# Patient Record
Sex: Male | Born: 1970 | Race: Black or African American | Hispanic: No | Marital: Single | Smoking: Former smoker
Health system: Southern US, Community
[De-identification: ages and names within clinical notes are randomized; demographics above are authoritative.]

---

## 2012-05-22 ENCOUNTER — Emergency Department (HOSPITAL_COMMUNITY)
Admission: EM | Admit: 2012-05-22 | Discharge: 2012-05-22 | Disposition: A | Discharge status: Home / Self Care | Payer: Managed Care, Other (non HMO) | Attending: Emergency Medicine | Admitting: Emergency Medicine

## 2012-05-22 ENCOUNTER — Encounter (HOSPITAL_COMMUNITY): Payer: Self-pay | Admitting: *Deleted

## 2012-05-22 DIAGNOSIS — K4090 Unilateral inguinal hernia, without obstruction or gangrene, not specified as recurrent: Secondary | ICD-10-CM | POA: Insufficient documentation

## 2012-05-22 NOTE — ED Notes (Signed)
Dr. Wofford at bedside 

## 2012-05-22 NOTE — ED Provider Notes (Signed)
I saw and evaluated the patient, reviewed the resident's note and I agree with the findings and plan.   Pt with right groin hernia, easily reduced.  No signs of incarceration. Discussed with general surgeon, return precautions discussed, urged close follow up with a surgeon.    Ralph Tran. Oletta Lamas, MD 05/22/12 [phone]

## 2012-05-22 NOTE — ED Provider Notes (Signed)
History     CSN: [id number]  Arrival date & time 05/22/12  1551   First MD Initiated Contact with Patient 05/22/12 1705      Chief Complaint  Patient presents with  . R groin/abd pain     (Consider location/radiation/quality/duration/timing/severity/associated sxs/prior treatment) Patient is a 42 y.o. male presenting with male genitourinary complaint. The history is provided by the patient.  Male GU Problem Primary symptoms comment: groin swelling. This is a new problem. Episode onset: several days ago. Episode frequency: intermittent. The problem has been gradually worsening. Context: worse when standing, better when lying down. Associated symptoms include abdominal pain (RLQ). Pertinent negatives include no nausea, no vomiting, no frequency, no constipation and no diarrhea. There has been no fever. He has tried nothing for the symptoms.    History reviewed. No pertinent past medical history.  History reviewed. No pertinent past surgical history.  No family history on file.  History  Substance Use Topics  . Smoking status: Former Games developer  . Smokeless tobacco: Not on file  . Alcohol Use: Yes     Comment: daily      Review of Systems  Constitutional: Negative for fever.  HENT: Negative for congestion, facial swelling and trouble swallowing.   Respiratory: Negative for cough and shortness of breath.   Cardiovascular: Negative for chest pain.  Gastrointestinal: Positive for abdominal pain (RLQ). Negative for nausea, vomiting, diarrhea and constipation.  Genitourinary: Negative for frequency and difficulty urinating.  All other systems reviewed and are negative.    Allergies  Review of patient's allergies indicates no known allergies.  Home Medications  No current outpatient prescriptions on file.  BP 138/82  Pulse 66  Temp(Src) 97.7 F (36.5 C) (Oral)  Resp 18  SpO2 100%  Physical Exam  Nursing note and vitals reviewed. Constitutional: He is oriented to  person, place, and time. He appears well-developed and well-nourished. No distress.  HENT:  Head: Normocephalic and atraumatic.  Mouth/Throat: Oropharynx is clear and moist.  Eyes: Conjunctivae are normal. Pupils are equal, round, and reactive to light. No scleral icterus.  Neck: Normal range of motion. Neck supple.  Cardiovascular: Normal rate, regular rhythm and intact distal pulses.   Pulmonary/Chest: Effort normal. No stridor. No respiratory distress.  Abdominal: Soft. He exhibits no distension. There is no tenderness. There is no rebound and no guarding. A hernia is present. Hernia confirmed positive in the right inguinal area.  Genitourinary: Right testis shows mass (soft, reducible). Right testis shows no tenderness. Left testis shows no mass and no tenderness. Uncircumcised. No discharge found.  Musculoskeletal: Normal range of motion. He exhibits no edema.  Neurological: He is alert and oriented to person, place, and time.  Skin: Skin is warm and dry. No rash noted.  Psychiatric: He has a normal mood and affect. His behavior is normal.    ED Course  Hernia reduction Date/Time: 05/22/2012 5:42 PM Performed by: Rennis Petty Authorized by: Lear Ng Consent: Verbal consent obtained. Patient tolerance: Patient tolerated the procedure well with no immediate complications. Comments: Reduced right inguinal hernia with constant direct pressure.     (including critical care time)  Labs Reviewed - No data to display No results found.   1. Right inguinal hernia       MDM  Easily reducible large right inguinal hernia.  Plan surgery follow up and lifting precautions.         Rennis Petty, MD 05/22/12 [phone]

## 2012-05-22 NOTE — ED Notes (Signed)
Pt is here with couple days of lower right abdominal pain and discomfort in groin area.  No NVD or problems urinating.

## 2012-05-22 NOTE — ED Notes (Signed)
Pt given d/c teaching and follow up care instructions; pt alert and mentating appropriately upon d.c teaching given; pt denies pain upon d/c; pt verbalizes understanding of d/c teaching and has no further questions upon d/c. NAD noted upon d/c.

## 2012-05-22 NOTE — ED Notes (Signed)
Pt states he lifts heavy items at work and has placed increased strain on lower body; pt states he has seen the hernia at times on his right groin area; pt denies n/v/d; pt denies pain; pt alert and mentating appropriately; NAD noted at this time

## 2012-05-23 ENCOUNTER — Telehealth (INDEPENDENT_AMBULATORY_CARE_PROVIDER_SITE_OTHER): Payer: Self-pay | Admitting: General Surgery

## 2012-05-23 NOTE — Telephone Encounter (Signed)
LMOM making patient aware of appt date/time scheduled for Thursday 05/25/2012 @ 4:30pm. Patient to call with any questions.

## 2012-05-23 NOTE — Telephone Encounter (Signed)
Message copied by Liliana Cline on Tue May 23, 2012  9:13 AM ------      Message from: Almond Lint      Created: Tue May 23, 2012  3:45 AM       This guy needs appt for inguinal hernia with Dr. Andrey Campanile.      tx      FB ------

## 2012-05-25 ENCOUNTER — Encounter (INDEPENDENT_AMBULATORY_CARE_PROVIDER_SITE_OTHER): Payer: Self-pay | Admitting: General Surgery

## 2012-05-25 ENCOUNTER — Ambulatory Visit (INDEPENDENT_AMBULATORY_CARE_PROVIDER_SITE_OTHER): Payer: Managed Care, Other (non HMO) | Admitting: General Surgery

## 2012-05-25 VITALS — BP 118/80 | HR 61 | Temp 100.1°F | Resp 18 | Ht 74.0 in | Wt 160.6 lb

## 2012-05-25 DIAGNOSIS — K4090 Unilateral inguinal hernia, without obstruction or gangrene, not specified as recurrent: Secondary | ICD-10-CM | POA: Insufficient documentation

## 2012-05-25 NOTE — Patient Instructions (Signed)
Hernia A hernia occurs when an internal organ pushes out through a weak spot in the abdominal wall. Hernias most commonly occur in the groin and around the navel. Hernias often can be pushed back into place (reduced). Most hernias tend to get worse over time. Some abdominal hernias can get stuck in the opening (irreducible or incarcerated hernia) and cannot be reduced. An irreducible abdominal hernia which is tightly squeezed into the opening is at risk for impaired blood supply (strangulated hernia). A strangulated hernia is a medical emergency. Because of the risk for an irreducible or strangulated hernia, surgery may be recommended to repair a hernia. CAUSES   Heavy lifting.  Prolonged coughing.  Straining to have a bowel movement.  A cut (incision) made during an abdominal surgery. HOME CARE INSTRUCTIONS   Bed rest is not required. You may continue your normal activities.  Avoid lifting more than 10 pounds (4.5 kg) or straining.  Cough gently. If you are a smoker it is best to stop. Even the best hernia repair can break down with the continual strain of coughing. Even if you do not have your hernia repaired, a cough will continue to aggravate the problem.  Do not wear anything tight over your hernia. Do not try to keep it in with an outside bandage or truss. These can damage abdominal contents if they are trapped within the hernia sac.  Eat a normal diet.  Avoid constipation. Straining over long periods of time will increase hernia size and encourage breakdown of repairs. If you cannot do this with diet alone, stool softeners may be used. SEEK IMMEDIATE MEDICAL CARE IF:   You have a fever.  You develop increasing abdominal pain.  You feel nauseous or vomit.  Your hernia is stuck outside the abdomen, looks discolored, feels hard, or is tender.  You have any changes in your bowel habits or in the hernia that are unusual for you.  You have increased pain or swelling around the  hernia.  You cannot push the hernia back in place by applying gentle pressure while lying down. MAKE SURE YOU:   Understand these instructions.  Will watch your condition.  Will get help right away if you are not doing well or get worse. Document Released: 02/22/2005 Document Revised: 05/17/2011 Document Reviewed: 10/12/2007 Exit[employer] Patient Information 2013 [employer].  

## 2012-05-25 NOTE — Progress Notes (Signed)
Patient ID: Ralph Tran, male   DOB: 1970-12-10, 42 y.o.   MRN: [id number]  Chief Complaint  Patient presents with  . New Evaluation    eval ing hernia    HPI Ralph Tran is a 42 y.o. male.   HPI 42yo AAM referred by Dr Quita Skye For evaluation of a right inguinal hernia. The patient states that he noticed it about 2 years ago. At that time it was small. Over the past 2 years he has had some intermittent abdominal pain. More recently he has had a pulling sensation in his right groin. This sensation is typically noticed toward the end of the day, when he is getting up from a laying position, or after heavy lifting. He does have some discomfort in his right groin after standing or walking for prolonged period of time. He denies any fever, chills, nausea, vomiting, diarrhea or constipation. He denies any dysuria. He works in Teacher, English as a foreign language.   History reviewed. No pertinent past medical history.  History reviewed. No pertinent past surgical history.  Family History  Problem Relation Age of Onset  . Heart disease Father     Social History History  Substance Use Topics  . Smoking status: Former Smoker    Types: Cigarettes    Quit date: 05/06/2008  . Smokeless tobacco: Never Used  . Alcohol Use: 2.4 oz/week    4 Cans of beer per week     Comment: Daily.    No Known Allergies  No current outpatient prescriptions on file.   No current facility-administered medications for this visit.    Review of Systems Review of Systems  Constitutional: Negative for fever, chills, appetite change and unexpected weight change.  HENT: Negative for congestion and trouble swallowing.   Eyes: Negative for visual disturbance.  Respiratory: Negative for chest tightness and shortness of breath.   Cardiovascular: Negative for chest pain and leg swelling.       No PND, no orthopnea, no DOE  Gastrointestinal:       See HPI  Genitourinary: Negative for dysuria and hematuria.  Musculoskeletal:  Negative.   Skin: Negative for rash.  Neurological: Negative for seizures and speech difficulty.  Hematological: Does not bruise/bleed easily.  Psychiatric/Behavioral: Negative for behavioral problems and confusion.    Blood pressure 118/80, pulse 61, temperature 100.1 F (37.8 C), temperature source Temporal, resp. rate 18, height 6\' 2"  (1.88 m), weight 160 lb 9.6 oz (72.848 kg).  Physical Exam Physical Exam  Vitals reviewed. Constitutional: He is oriented to person, place, and time. He appears well-developed and well-nourished. No distress.  HENT:  Head: Normocephalic and atraumatic.  Right Ear: External ear normal.  Left Ear: External ear normal.  Eyes: Conjunctivae are normal. No scleral icterus.  Neck: Normal range of motion. Neck supple. No tracheal deviation present. No thyromegaly present.  Cardiovascular: Normal rate, regular rhythm and normal heart sounds.   Pulmonary/Chest: Effort normal and breath sounds normal. No respiratory distress. He has no wheezes. He has no rales.  Abdominal: Soft. He exhibits no distension. There is no tenderness. There is no rebound. A hernia is present. Hernia confirmed positive in the right inguinal area. Hernia confirmed negative in the left inguinal area.  Genitourinary: Testes normal and penis normal.    Uncircumcised.  Large right inguinal/scrotal hernia, soft, nontender; reducible.   Musculoskeletal: He exhibits no edema.  Lymphadenopathy:    He has no cervical adenopathy.  Neurological: He is alert and oriented to person, place, and time.  Skin: Skin  is warm and dry. No rash noted. He is not diaphoretic. No erythema. No pallor.  Psychiatric: He has a normal mood and affect. His behavior is normal. Judgment and thought content normal.    Data Reviewed ED note  Assessment    Large right inguinal hernia     Plan    We discussed the etiology of inguinal hernias. We discussed the signs & symptoms of incarceration & strangulation.   We discussed non-operative and operative management.  The patient has elected to proceed with OPEN REPAIR OF RIGHT INGUINAL HERNIA WITH MESH   I described the procedure in detail.  The patient was given educational material. We discussed the risks and benefits including but not limited to bleeding, infection, chronic inguinal pain, nerve entrapment, hernia recurrence, mesh complications, hematoma formation, urinary retention, injury to the testicles, numbness in the groin, blood clots, injury to the surrounding structures, and anesthesia risk. We also discussed the typical post operative recovery course, including no heavy lifting for 6 weeks. I explained that the likelihood of improvement of their symptoms is good.  I explained to the patient that he was at higher risk for hematoma formation, hernia recurrence, chronic inguinal pain, and injury to surrounding structures given the size of his inguinal hernia.  He has elected to proceed with surgery  Mary Sella. Andrey Campanile, MD, FACS General, Bariatric, & Minimally Invasive Surgery [facility], [place]          [facility] M 05/25/2012, 5:14 PM

## 2017-04-15 ENCOUNTER — Ambulatory Visit (HOSPITAL_COMMUNITY)
Admission: EM | Admit: 2017-04-15 | Discharge: 2017-04-17 | Disposition: A | Discharge status: Home / Self Care | Payer: Managed Care, Other (non HMO) | Attending: General Surgery | Admitting: General Surgery

## 2017-04-15 ENCOUNTER — Encounter (HOSPITAL_COMMUNITY): Payer: Self-pay

## 2017-04-15 ENCOUNTER — Other Ambulatory Visit: Payer: Self-pay

## 2017-04-15 DIAGNOSIS — R61 Generalized hyperhidrosis: Secondary | ICD-10-CM | POA: Insufficient documentation

## 2017-04-15 DIAGNOSIS — Z8249 Family history of ischemic heart disease and other diseases of the circulatory system: Secondary | ICD-10-CM | POA: Insufficient documentation

## 2017-04-15 DIAGNOSIS — F17210 Nicotine dependence, cigarettes, uncomplicated: Secondary | ICD-10-CM | POA: Insufficient documentation

## 2017-04-15 DIAGNOSIS — K4030 Unilateral inguinal hernia, with obstruction, without gangrene, not specified as recurrent: Secondary | ICD-10-CM | POA: Insufficient documentation

## 2017-04-15 LAB — I-STAT CG4 LACTIC ACID, ED: Lactic Acid, Venous: 5.02 mmol/L (ref 0.5–1.9)

## 2017-04-15 MED ORDER — ONDANSETRON HCL 4 MG/2ML IJ SOLN
4.0000 mg | Freq: Once | INTRAMUSCULAR | Status: AC
  Administered 2017-04-15: 4 mg via INTRAVENOUS
  Filled 2017-04-15: qty 2

## 2017-04-15 MED ORDER — HYDROMORPHONE HCL 1 MG/ML IJ SOLN
1.0000 mg | Freq: Once | INTRAMUSCULAR | Status: AC
Start: 2017-04-15 — End: 2017-04-15
  Administered 2017-04-15: 1 mg via INTRAVENOUS
  Filled 2017-04-15: qty 1

## 2017-04-15 MED ORDER — SODIUM CHLORIDE 0.9 % IV BOLUS (SEPSIS)
1000.0000 mL | Freq: Once | INTRAVENOUS | Status: AC
  Administered 2017-04-15: 1000 mL via INTRAVENOUS

## 2017-04-15 NOTE — ED Provider Notes (Addendum)
[facility] EMERGENCY DEPARTMENT Provider Note   CSN: [id number] Arrival date & time: 04/15/17  2300     History   Chief Complaint Chief Complaint  Patient presents with  . Abdominal Pain    HPI Ralph Tran is a 47 y.o. male.  Patient presents to the ER for evaluation of abdominal pain.  Patient comes to the ER by ambulance.  1 hour before arrival in the ER he had sudden onset of severe, sharp and tearing pain in his right groin when he turns to the side and laughed.  He has a history of a known right inguinal hernia.  Patient received fentanyl 50 mcg x2 by EMS.  Blood pressure dropped somewhat, has been given a 500 mL fluid bolus.  Patient arrives still experiencing severe pain.  He has had multiple episodes of emesis.      History reviewed. No pertinent past medical history.  Patient Active Problem List   Diagnosis Date Noted  . Right inguinal hernia 05/25/2012    History reviewed. No pertinent surgical history.     Home Medications    Prior to Admission medications   Not on File    Family History Family History  Problem Relation Age of Onset  . Heart disease Father     Social History Social History   Tobacco Use  . Smoking status: Former Smoker    Types: Cigarettes    Last attempt to quit: 05/06/2008    Years since quitting: 8.9  . Smokeless tobacco: Never Used  Substance Use Topics  . Alcohol use: Yes    Alcohol/week: 2.4 oz    Types: 4 Cans of beer per week    Comment: Daily.  . Drug use: No     Allergies   Patient has no known allergies.   Review of Systems Review of Systems  Gastrointestinal: Positive for abdominal pain.  All other systems reviewed and are negative.    Physical Exam Updated Vital Signs BP 111/74 (BP Location: Right Arm)   Pulse (!) 55   Temp 98.3 F (36.8 C) (Rectal)   Resp 20   Ht 6\' 3"  (1.905 m)   Wt 72.6 kg (160 lb)   SpO2 100%   BMI 20.00 kg/m   Physical Exam  Constitutional: He is  oriented to person, place, and time. He appears well-developed and well-nourished. No distress.  HENT:  Head: Normocephalic and atraumatic.  Right Ear: Hearing normal.  Left Ear: Hearing normal.  Nose: Nose normal.  Mouth/Throat: Oropharynx is clear and moist and mucous membranes are normal.  Eyes: Conjunctivae and EOM are normal. Pupils are equal, round, and reactive to light.  Neck: Normal range of motion. Neck supple.  Cardiovascular: Regular rhythm, S1 normal and S2 normal. Exam reveals no gallop and no friction rub.  No murmur heard. Pulmonary/Chest: Effort normal and breath sounds normal. No respiratory distress. He exhibits no tenderness.  Abdominal: Soft. Normal appearance and bowel sounds are normal. There is no hepatosplenomegaly. There is no tenderness. There is no rebound, no guarding, no tenderness at McBurney's point and negative Murphy's sign. A hernia (large right inguinal, non-reducible) is present.  Musculoskeletal: Normal range of motion.  Neurological: He is alert and oriented to person, place, and time. He has normal strength. No cranial nerve deficit or sensory deficit. Coordination normal. GCS eye subscore is 4. GCS verbal subscore is 5. GCS motor subscore is 6.  Skin: Skin is warm, dry and intact. No rash noted. No cyanosis.  Psychiatric: He has a normal mood and affect. His speech is normal and behavior is normal. Thought content normal.  Nursing note and vitals reviewed.    ED Treatments / Results  Labs (all labs ordered are listed, but only abnormal results are displayed) Labs Reviewed  I-STAT CG4 LACTIC ACID, ED - Abnormal; Notable for the following components:      Result Value   Lactic Acid, Venous 5.02 (*)    All other components within normal limits  CBC WITH DIFFERENTIAL/PLATELET  BASIC METABOLIC PANEL  LIPASE, BLOOD  URINALYSIS, ROUTINE W REFLEX MICROSCOPIC    EKG  EKG Interpretation  Date/Time:  Friday April 15 2017 23:32:52 EST Ventricular  Rate:  50 PR Interval:    QRS Duration: 106 QT Interval:  462 QTC Calculation: 422 R Axis:   50 Text Interpretation:  Sinus rhythm Atrial premature complex RSR' in V1 or V2, probably normal variant Left ventricular hypertrophy Nonspecific T abnrm, anterolateral leads ST elev, probable normal early repol pattern No previous tracing Confirmed by Gilda CreasePollina, Christopher J [phone]([id number]) on 04/15/2017 11:53:41 PM       Radiology No results found.  Procedures Procedures (including critical care time)  Medications Ordered in ED Medications  sodium chloride 0.9 % bolus 1,000 mL (1,000 mLs Intravenous New Bag/Given 04/15/17 2314)  HYDROmorphone (DILAUDID) injection 1 mg (1 mg Intravenous Given 04/15/17 2312)  ondansetron (ZOFRAN) injection 4 mg (4 mg Intravenous Given 04/15/17 2312)     Initial Impression / Assessment and Plan / ED Course  I have reviewed the triage vital signs and the nursing notes.  Pertinent labs & imaging results that were available during my care of the patient were reviewed by me and considered in my medical decision making (see chart for details).     Patient presents to the emergency department for evaluation of sudden onset of abdominal pain after a Valsalva maneuver.  He has a known right inguinal hernia.  Examination reveals large hernia mass into the right scrotum.  This is consistent with incarcerated inguinal hernia.  Patient administered IV fluid bolus, additional analgesia.  Discussed with general surgery, will see the patient.  Final Clinical Impressions(s) / ED Diagnoses   Final diagnoses:  Incarcerated right inguinal hernia    ED Discharge Orders    None       Gilda CreasePollina, Christopher J, MD 04/15/17 [id number]    Gilda CreasePollina, Christopher J, MD 04/15/17 2354

## 2017-04-15 NOTE — ED Triage Notes (Addendum)
Per [facility], pt called out for sudden sharp right sided abdominal pain after laughing. Pt also did heavy lifting at work. Pt has hx right inguinal hernia. Pt received 100 mcg of fentanyl via EMS and 4 of zofran. Pt has 20 G in RAC. Pt received 250 bolus of NS via EMS after systolic pressure dropped from 108 to 82. Pt is alert and oriented. EMS reports multiple episodes of N/V and rigid abdomen.

## 2017-04-15 NOTE — ED Notes (Signed)
ED Provider at bedside. 

## 2017-04-16 ENCOUNTER — Emergency Department (HOSPITAL_COMMUNITY): Payer: Managed Care, Other (non HMO)

## 2017-04-16 ENCOUNTER — Emergency Department (HOSPITAL_COMMUNITY): Payer: Managed Care, Other (non HMO) | Admitting: Anesthesiology

## 2017-04-16 ENCOUNTER — Encounter (HOSPITAL_COMMUNITY): Payer: Self-pay

## 2017-04-16 ENCOUNTER — Encounter (HOSPITAL_COMMUNITY)
Admission: EM | Disposition: A | Discharge status: Home / Self Care | Payer: Self-pay | Source: Home / Self Care | Attending: Emergency Medicine

## 2017-04-16 ENCOUNTER — Other Ambulatory Visit: Payer: Self-pay

## 2017-04-16 DIAGNOSIS — K4030 Unilateral inguinal hernia, with obstruction, without gangrene, not specified as recurrent: Secondary | ICD-10-CM | POA: Diagnosis present

## 2017-04-16 HISTORY — PX: INGUINAL HERNIA REPAIR: SHX194

## 2017-04-16 LAB — CBC WITH DIFFERENTIAL/PLATELET
BASOS ABS: 0 10*3/uL (ref 0.0–0.1)
Basophils Relative: 0 %
EOS PCT: 1 %
Eosinophils Absolute: 0.1 10*3/uL (ref 0.0–0.7)
HEMATOCRIT: 39.5 % (ref 39.0–52.0)
Hemoglobin: 12.9 g/dL — ABNORMAL LOW (ref 13.0–17.0)
LYMPHS PCT: 21 %
Lymphs Abs: 1.9 10*3/uL (ref 0.7–4.0)
MCH: 31.6 pg (ref 26.0–34.0)
MCHC: 32.7 g/dL (ref 30.0–36.0)
MCV: 96.8 fL (ref 78.0–100.0)
MONO ABS: 0.5 10*3/uL (ref 0.1–1.0)
MONOS PCT: 5 %
Neutro Abs: 6.5 10*3/uL (ref 1.7–7.7)
Neutrophils Relative %: 73 %
PLATELETS: 227 10*3/uL (ref 150–400)
RBC: 4.08 MIL/uL — ABNORMAL LOW (ref 4.22–5.81)
RDW: 14.1 % (ref 11.5–15.5)
WBC: 9 10*3/uL (ref 4.0–10.5)

## 2017-04-16 LAB — BASIC METABOLIC PANEL
ANION GAP: 11 (ref 5–15)
ANION GAP: 12 (ref 5–15)
BUN: 7 mg/dL (ref 6–20)
BUN: 9 mg/dL (ref 6–20)
CALCIUM: 7.9 mg/dL — AB (ref 8.9–10.3)
CALCIUM: 8.1 mg/dL — AB (ref 8.9–10.3)
CO2: 23 mmol/L (ref 22–32)
CO2: 22 mmol/L (ref 22–32)
CREATININE: 0.92 mg/dL (ref 0.61–1.24)
Chloride: 103 mmol/L (ref 101–111)
Chloride: 108 mmol/L (ref 101–111)
Creatinine, Ser: 0.81 mg/dL (ref 0.61–1.24)
GFR calc Af Amer: >60 mL/min (ref >60)
GFR calc Af Amer: >60 mL/min (ref >60)
GLUCOSE: 122 mg/dL — AB (ref 65–99)
GLUCOSE: 125 mg/dL — AB (ref 65–99)
Potassium: 3.5 mmol/L (ref 3.5–5.1)
Potassium: 4 mmol/L (ref 3.5–5.1)
Sodium: 137 mmol/L (ref 135–145)
Sodium: 142 mmol/L (ref 135–145)

## 2017-04-16 LAB — LIPASE, BLOOD: Lipase: 25 U/L (ref 11–51)

## 2017-04-16 SURGERY — REPAIR, HERNIA, INGUINAL, INCARCERATED
Anesthesia: General | Laterality: Right

## 2017-04-16 MED ORDER — SUGAMMADEX SODIUM 200 MG/2ML IV SOLN
INTRAVENOUS | Status: DC | PRN
  Administered 2017-04-16: 200 mg via INTRAVENOUS

## 2017-04-16 MED ORDER — SUCCINYLCHOLINE 20MG/ML (10ML) SYRINGE FOR MEDFUSION PUMP - OPTIME
INTRAMUSCULAR | Status: DC | PRN
  Administered 2017-04-16: 120 mg via INTRAVENOUS

## 2017-04-16 MED ORDER — ACETAMINOPHEN 325 MG PO TABS: 650.0000 mg | ORAL_TABLET | Freq: Four times a day (QID) | ORAL | Status: DC | PRN

## 2017-04-16 MED ORDER — CEFAZOLIN SODIUM-DEXTROSE 2-4 GM/100ML-% IV SOLN
INTRAVENOUS | Status: AC
  Filled 2017-04-16: qty 100

## 2017-04-16 MED ORDER — HYDROMORPHONE HCL 1 MG/ML IJ SOLN
1.0000 mg | Freq: Once | INTRAMUSCULAR | Status: AC
  Administered 2017-04-16: 1 mg via INTRAVENOUS
  Filled 2017-04-16: qty 1

## 2017-04-16 MED ORDER — ACETAMINOPHEN 650 MG RE SUPP: 650.0000 mg | Freq: Four times a day (QID) | RECTAL | Status: DC | PRN

## 2017-04-16 MED ORDER — KETOROLAC TROMETHAMINE 15 MG/ML IJ SOLN
15.0000 mg | Freq: Four times a day (QID) | INTRAMUSCULAR | Status: DC | PRN
  Administered 2017-04-16: 15 mg via INTRAVENOUS
  Filled 2017-04-16: qty 1

## 2017-04-16 MED ORDER — BUPIVACAINE HCL (PF) 0.25 % IJ SOLN
INTRAMUSCULAR | Status: AC
  Filled 2017-04-16: qty 30

## 2017-04-16 MED ORDER — DEXAMETHASONE SODIUM PHOSPHATE 10 MG/ML IJ SOLN
INTRAMUSCULAR | Status: DC | PRN
  Administered 2017-04-16: 10 mg via INTRAVENOUS

## 2017-04-16 MED ORDER — DOCUSATE SODIUM 100 MG PO CAPS
100.0000 mg | ORAL_CAPSULE | Freq: Two times a day (BID) | ORAL | Status: DC
  Administered 2017-04-16 – 2017-04-17 (×3): 100 mg via ORAL
  Filled 2017-04-16 (×3): qty 1

## 2017-04-16 MED ORDER — HYDROMORPHONE HCL 1 MG/ML IJ SOLN
0.2500 mg | INTRAMUSCULAR | Status: DC | PRN
  Administered 2017-04-16 (×2): 0.5 mg via INTRAVENOUS

## 2017-04-16 MED ORDER — FENTANYL CITRATE (PF) 250 MCG/5ML IJ SOLN
INTRAMUSCULAR | Status: DC | PRN
  Administered 2017-04-16: 100 ug via INTRAVENOUS
  Administered 2017-04-16: 50 ug via INTRAVENOUS

## 2017-04-16 MED ORDER — DEXTROSE 5 % IV SOLN: 500.0000 mg | Freq: Three times a day (TID) | INTRAVENOUS | Status: DC | PRN

## 2017-04-16 MED ORDER — DIPHENHYDRAMINE HCL 50 MG/ML IJ SOLN
25.0000 mg | Freq: Three times a day (TID) | INTRAMUSCULAR | Status: DC | PRN
  Administered 2017-04-16: 25 mg via INTRAVENOUS
  Filled 2017-04-16: qty 1

## 2017-04-16 MED ORDER — ENOXAPARIN SODIUM 40 MG/0.4ML ~~LOC~~ SOLN: 40.0000 mg | SUBCUTANEOUS | Status: DC

## 2017-04-16 MED ORDER — GABAPENTIN 300 MG PO CAPS
300.0000 mg | ORAL_CAPSULE | Freq: Three times a day (TID) | ORAL | Status: DC
  Administered 2017-04-16 (×3): 300 mg via ORAL
  Filled 2017-04-16 (×3): qty 1

## 2017-04-16 MED ORDER — MIDAZOLAM HCL 2 MG/2ML IJ SOLN
INTRAMUSCULAR | Status: DC | PRN
  Administered 2017-04-16: 2 mg via INTRAVENOUS

## 2017-04-16 MED ORDER — MIDAZOLAM HCL 2 MG/2ML IJ SOLN
INTRAMUSCULAR | Status: AC
  Filled 2017-04-16: qty 2

## 2017-04-16 MED ORDER — FENTANYL CITRATE (PF) 250 MCG/5ML IJ SOLN
INTRAMUSCULAR | Status: AC
  Filled 2017-04-16: qty 5

## 2017-04-16 MED ORDER — ONDANSETRON HCL 4 MG/2ML IJ SOLN
INTRAMUSCULAR | Status: DC | PRN
  Administered 2017-04-16: 4 mg via INTRAVENOUS

## 2017-04-16 MED ORDER — OXYCODONE HCL 5 MG PO TABS: 5.0000 mg | ORAL_TABLET | Freq: Four times a day (QID) | ORAL | Status: DC | PRN

## 2017-04-16 MED ORDER — SODIUM CHLORIDE 0.9 % IV SOLN
INTRAVENOUS | Status: DC
  Administered 2017-04-16 (×2): via INTRAVENOUS

## 2017-04-16 MED ORDER — PROPOFOL 10 MG/ML IV BOLUS
INTRAVENOUS | Status: DC | PRN
  Administered 2017-04-16: 180 mg via INTRAVENOUS

## 2017-04-16 MED ORDER — CEFAZOLIN SODIUM-DEXTROSE 2-3 GM-%(50ML) IV SOLR
INTRAVENOUS | Status: DC | PRN
  Administered 2017-04-16: 2 g via INTRAVENOUS

## 2017-04-16 MED ORDER — ROCURONIUM 10MG/ML (10ML) SYRINGE FOR MEDFUSION PUMP - OPTIME
INTRAVENOUS | Status: DC | PRN
  Administered 2017-04-16: 30 mg via INTRAVENOUS

## 2017-04-16 MED ORDER — ACETAMINOPHEN 325 MG PO TABS
650.0000 mg | ORAL_TABLET | Freq: Four times a day (QID) | ORAL | Status: DC
  Administered 2017-04-16 – 2017-04-17 (×5): 650 mg via ORAL
  Filled 2017-04-16 (×5): qty 2

## 2017-04-16 MED ORDER — ONDANSETRON 4 MG PO TBDP: 4.0000 mg | ORAL_TABLET | Freq: Four times a day (QID) | ORAL | Status: DC | PRN

## 2017-04-16 MED ORDER — PROPOFOL 10 MG/ML IV BOLUS
INTRAVENOUS | Status: AC
  Filled 2017-04-16: qty 20

## 2017-04-16 MED ORDER — HYDROMORPHONE HCL 1 MG/ML IJ SOLN
INTRAMUSCULAR | Status: AC
  Administered 2017-04-16: 0.5 mg via INTRAVENOUS
  Filled 2017-04-16: qty 1

## 2017-04-16 MED ORDER — LIDOCAINE HCL (CARDIAC) 20 MG/ML IV SOLN
INTRAVENOUS | Status: DC | PRN
  Administered 2017-04-16: 60 mg via INTRATRACHEAL

## 2017-04-16 MED ORDER — HYDROMORPHONE HCL 1 MG/ML IJ SOLN: 0.5000 mg | INTRAMUSCULAR | Status: DC | PRN

## 2017-04-16 MED ORDER — ONDANSETRON HCL 4 MG/2ML IJ SOLN: 4.0000 mg | Freq: Four times a day (QID) | INTRAMUSCULAR | Status: DC | PRN

## 2017-04-16 MED ORDER — LACTATED RINGERS IV SOLN
INTRAVENOUS | Status: DC | PRN
Start: 2017-04-16 — End: 2017-04-16
  Administered 2017-04-16 (×2): via INTRAVENOUS

## 2017-04-16 SURGICAL SUPPLY — 55 items
APPLIER CLIP 9.375 MED OPEN (MISCELLANEOUS) ×3
BLADE CLIPPER SURG (BLADE) ×3 IMPLANT
BLADE SURG 10 STRL SS (BLADE) ×3 IMPLANT
BLADE SURG 15 STRL LF DISP TIS (BLADE) ×1 IMPLANT
BLADE SURG 15 STRL SS (BLADE) ×2
CANISTER SUCT 3000ML PPV (MISCELLANEOUS) ×3 IMPLANT
CHLORAPREP W/TINT 26ML (MISCELLANEOUS) ×3 IMPLANT
CLIP APPLIE 9.375 MED OPEN (MISCELLANEOUS) ×1 IMPLANT
COVER SURGICAL LIGHT HANDLE (MISCELLANEOUS) ×3 IMPLANT
DECANTER SPIKE VIAL GLASS SM (MISCELLANEOUS) ×3 IMPLANT
DERMABOND ADHESIVE PROPEN (GAUZE/BANDAGES/DRESSINGS) ×2
DERMABOND ADVANCED (GAUZE/BANDAGES/DRESSINGS) ×2
DERMABOND ADVANCED .7 DNX12 (GAUZE/BANDAGES/DRESSINGS) ×1 IMPLANT
DERMABOND ADVANCED .7 DNX6 (GAUZE/BANDAGES/DRESSINGS) ×1 IMPLANT
DRAIN PENROSE 1/2X12 LTX STRL (WOUND CARE) ×3 IMPLANT
DRAPE LAPAROTOMY T 102X78X121 (DRAPES) ×3 IMPLANT
DRAPE LAPAROTOMY TRNSV 102X78 (DRAPE) ×3 IMPLANT
DRAPE UTILITY XL STRL (DRAPES) ×3 IMPLANT
ELECT CAUTERY BLADE 6.4 (BLADE) ×3 IMPLANT
ELECT REM PT RETURN 9FT ADLT (ELECTROSURGICAL) ×3
ELECTRODE REM PT RTRN 9FT ADLT (ELECTROSURGICAL) ×1 IMPLANT
GAUZE SPONGE 4X4 16PLY XRAY LF (GAUZE/BANDAGES/DRESSINGS) ×3 IMPLANT
GLOVE BIO SURGEON STRL SZ 6.5 (GLOVE) ×2 IMPLANT
GLOVE BIO SURGEON STRL SZ7 (GLOVE) ×3 IMPLANT
GLOVE BIO SURGEONS STRL SZ 6.5 (GLOVE) ×1
GLOVE BIOGEL PI IND STRL 7.5 (GLOVE) ×1 IMPLANT
GLOVE BIOGEL PI INDICATOR 7.5 (GLOVE) ×2
GOWN STRL REUS W/ TWL LRG LVL3 (GOWN DISPOSABLE) ×2 IMPLANT
GOWN STRL REUS W/TWL LRG LVL3 (GOWN DISPOSABLE) ×4
KIT BASIN OR (CUSTOM PROCEDURE TRAY) ×3 IMPLANT
KIT ROOM TURNOVER OR (KITS) ×3 IMPLANT
MARKER SKIN DUAL TIP RULER LAB (MISCELLANEOUS) ×3 IMPLANT
MESH HERNIA SYS ULTRAPRO LRG (Mesh General) ×3 IMPLANT
NEEDLE HYPO 25GX1X1/2 BEV (NEEDLE) ×3 IMPLANT
NS IRRIG 1000ML POUR BTL (IV SOLUTION) ×3 IMPLANT
PACK SURGICAL SETUP 50X90 (CUSTOM PROCEDURE TRAY) ×3 IMPLANT
PAD ARMBOARD 7.5X6 YLW CONV (MISCELLANEOUS) ×3 IMPLANT
PENCIL BUTTON HOLSTER BLD 10FT (ELECTRODE) ×3 IMPLANT
SPONGE LAP 18X18 X RAY DECT (DISPOSABLE) ×3 IMPLANT
SUT MNCRL AB 4-0 PS2 18 (SUTURE) ×3 IMPLANT
SUT VIC AB 0 CT1 18XCR BRD 8 (SUTURE) ×1 IMPLANT
SUT VIC AB 0 CT1 8-18 (SUTURE) ×2
SUT VIC AB 2-0 CT1 27 (SUTURE) ×4
SUT VIC AB 2-0 CT1 TAPERPNT 27 (SUTURE) ×2 IMPLANT
SUT VIC AB 2-0 SH 18 (SUTURE) ×6 IMPLANT
SUT VIC AB 3-0 SH 27 (SUTURE) ×2
SUT VIC AB 3-0 SH 27XBRD (SUTURE) ×1 IMPLANT
SUT VICRYL AB 2 0 TIES (SUTURE) ×3 IMPLANT
SYR CONTROL 10ML LL (SYRINGE) ×3 IMPLANT
TOWEL OR 17X24 6PK STRL BLUE (TOWEL DISPOSABLE) IMPLANT
TOWEL OR 17X26 10 PK STRL BLUE (TOWEL DISPOSABLE) ×3 IMPLANT
TRAY FOLEY CATH SILVER 16FR LF (SET/KITS/TRAYS/PACK) ×3 IMPLANT
TUBE CONNECTING 12'X1/4 (SUCTIONS) ×1
TUBE CONNECTING 12X1/4 (SUCTIONS) ×2 IMPLANT
YANKAUER SUCT BULB TIP NO VENT (SUCTIONS) ×3 IMPLANT

## 2017-04-16 NOTE — ED Notes (Signed)
X-ray at bedside

## 2017-04-16 NOTE — Op Note (Addendum)
Preoperative diagnosis: Incarcerated right inguinal hernia Postoperative diagnosis: Same as above Procedure: Right incarcerated inguinal hernia repair with ultra pro hernia system Surgeon: Dr. Harden MoMatt Ramone Tran Anesthesia: General Estimated blood loss: 50 cc Complications: None Drains: None Specimens: None Sponge needle count was correct at completion Disposition to recovery in stable condition  Indications: This is a 47 year old male who has a known inguinal hernia that in one of partners notes in 2014 was a scrotal inguinal hernia.  This has caused him some occasional discomfort.  Tonight it acutely became bigger and more tender.  This was associated with nausea and vomiting.  He presented to the emergency room and I was asked to see him for incarcerated groin hernia.  His lactic acid was elevated.  He clearly had an incarcerated right inguinal hernia and I discussed going to the operating room for an open inguinal hernia repair urgently.  Procedure: After informed consent was obtained the patient was taken to the operating room.  He was given antibiotics.  SCDs were in place.  He had a nasogastric tube in place from the emergency room.  He was then placed under general anesthesia without complication.  A Foley catheter was placed.  He was then prepped and draped in the standard sterile surgical fashion.  A surgical timeout was then performed.  I made a generous right groin incision.  I carried this out down to the external oblique.  This was incised sharply through the external ring.  The spermatic cord as well as what appeared to be a pantaloon hernia was identified.  I encircled the spermatic cord with a Penrose drain.  During this procedure it appeared that there was a combination of a direct and indirect hernia that both spontaneously reduced.  I then proceeded to dissect the indirect sac off the rest of the spermatic cord.  This clearly tracked all the way into his scrotum.  I decided to divide  this.  I left the distal end open.  The proximal end I then entered.  I then was able to evaluate the bowel through this sac.  I evaluated several feet of the small bowel as well as the appendix and the cecum.  The cecum was a portion of the hernia sac down low.  This was not injured.  All of the bowel was viable.  I then placed this back into the peritoneal cavity.  I then used a 2-0 Vicryl to oversew the sac at its base and divided.  This was then returned to the peritoneal cavity.  He really had no floor at all upon examination.  I elected to place an ultrapro hernia system.  I placed the bottom portion of the bilayer in the preperitoneal space and laid this flat.  I then closed the internal oblique down to the shelving edge.  I then laid the top portion of the bilayer flat.  I sutured this to the pubic tubercle with 2-0 Vicryl in several positions.  I then made a T cut and wrapped around the spermatic cord.  I then sutured this to the inguinal ligament every half centimeter with 2-0 Vicryl and laid the lateral edges flat beneath the oblique.  I sutured the T cut together around the spermatic cord.  The mesh was in good position.  This completely obliterated the defect.  I then obtained hemostasis.  I left a couple of clips on a small artery.  I then closed the external oblique with 2-0 Vicryl.  The Scarpa's fascia was closed with  3-0 Vicryl.  The skin was closed with 4-0 Monocryl and glue was placed.  He tolerated this well was transferred to recovery. I attempted to call his significant other Ralph Tran at end of surgery but got voicemail only.

## 2017-04-16 NOTE — Transfer of Care (Signed)
Immediate Anesthesia Transfer of Care Note  Patient: Ralph Tran  Procedure(s) Performed: REPAIR OF RIGHT INCARCERATED HERNIA W/ MESH (Right )  Patient Location: PACU  Anesthesia Type:General  Level of Consciousness: sedated  Airway & Oxygen Therapy: Patient connected to nasal cannula oxygen  Post-op Assessment: Report given to RN and Post -op Vital signs reviewed and stable  Post vital signs: Reviewed and stable  Last Vitals:  Vitals:   04/15/17 2345 04/16/17 0000  BP: 112/71 120/76  Pulse: 60 (!) 59  Resp: (!) 22 12  Temp:    SpO2: 100% 98%    Last Pain:  Vitals:   04/16/17 0048  TempSrc:   PainSc: 7          Complications: No apparent anesthesia complications

## 2017-04-16 NOTE — Discharge Instructions (Signed)
[facility] ° °UMBILICAL OR INGUINAL HERNIA REPAIR: POST OP INSTRUCTIONS ° °Always review your discharge instruction sheet given to you by the facility where your surgery was performed. °IF YOU HAVE DISABILITY OR FAMILY LEAVE FORMS, YOU MUST BRING THEM TO THE OFFICE FOR PROCESSING.   °DO NOT GIVE THEM TO YOUR DOCTOR. ° °1. A  prescription for pain medication may be given to you upon discharge.  Take your pain medication as prescribed, if needed.  If narcotic pain medicine is not needed, then you may take acetaminophen (Tylenol), naprosyn (Alleve) or ibuprofen (Advil) as needed. °2. Take your usually prescribed medications unless otherwise directed. °3. If you need a refill on your pain medication, please contact your pharmacy.  They will contact our office to request authorization. Prescriptions will not be filled after 5 pm or on week-ends. °4. You should follow a light diet the first 24 hours after arrival home, such as soup and crackers, etc.  Be sure to include lots of fluids daily.  Resume your normal diet the day after surgery. °5. Most patients will experience some swelling and bruising around the umbilicus or in the groin and scrotum.  Ice packs and reclining will help.  Swelling and bruising can take several days to resolve.  °6. It is common to experience some constipation if taking pain medication after surgery.  Increasing fluid intake and taking a stool softener (such as Colace) will usually help or prevent this problem from occurring.  A mild laxative (Milk of Magnesia or Miralax) should be taken according to package directions if there are no bowel movements after 48 hours. °7. Unless discharge instructions indicate otherwise, you may remove your bandages 48 hours after surgery, and you may shower at that time.  You may have steri-strips (small skin tapes) in place directly over the incision.  These strips should be left on the skin for 7-10 days and will come off on their own.   If your surgeon used skin glue on the incision, you may shower in 24 hours.  The glue will flake off over the next 2-3 weeks.  Any sutures or staples will be removed at the office during your follow-up visit. °8. ACTIVITIES:  You may resume regular (light) daily activities beginning the next day--such as daily self-care, walking, climbing stairs--gradually increasing activities as tolerated.  You may have sexual intercourse when it is comfortable.  Refrain from any heavy lifting or straining until approved by your doctor. °a. You may drive when you are no longer taking prescription pain medication, you can comfortably wear a seatbelt, and you can safely maneuver your car and apply brakes. °b. RETURN TO WORK:  __________________________________________________________ °9. You should see your doctor in the office for a follow-up appointment approximately 2-3 weeks after your surgery.  Make sure that you call for this appointment within a day or two after you arrive home to insure a convenient appointment time. °10. OTHER INSTRUCTIONS:  __________________________________________________________________________________________________________________________________________________________________________________________  °WHEN TO CALL YOUR DOCTOR: °1. Fever over 101.0 °2. Inability to urinate °3. Nausea and/or vomiting °4. Extreme swelling or bruising °5. Continued bleeding from incision. °6. Increased pain, redness, or drainage from the incision ° °The clinic staff is available to answer your questions during regular business hours.  Please don’t hesitate to call and ask to speak to one of the nurses for clinical concerns.  If you have a medical emergency, go to the nearest emergency room or call 911.  A surgeon from [facility]   is always on call at the hospital ° ° °[address], [address], [place], [place]  [address] ? ° [address] [address], [place],    [address] °[phone] ? [phone] ? FAX  [phone] °Web site: [email or web address] ° ° °

## 2017-04-16 NOTE — Anesthesia Procedure Notes (Signed)
Procedure Name: Intubation Date/Time: 04/16/2017 1:24 AM Performed by: Molli HazardGordon, Alannis Hsia M, CRNA Pre-anesthesia Checklist: Patient identified, Emergency Drugs available, Suction available and Patient being monitored Patient Re-evaluated:Patient Re-evaluated prior to induction Oxygen Delivery Method: Circle system utilized Preoxygenation: Pre-oxygenation with 100% oxygen Induction Type: IV induction, Cricoid Pressure applied and Rapid sequence Laryngoscope Size: Miller and 2 Grade View: Grade I Tube type: Oral Tube size: 8.0 mm Number of attempts: 1 Airway Equipment and Method: Stylet Placement Confirmation: ETT inserted through vocal cords under direct vision,  positive ETCO2 and breath sounds checked- equal and bilateral Secured at: 24 cm Tube secured with: Tape Dental Injury: Teeth and Oropharynx as per pre-operative assessment

## 2017-04-16 NOTE — Anesthesia Preprocedure Evaluation (Addendum)
Anesthesia Evaluation    Reviewed: Allergy & Precautions, NPO status , Patient's Chart, lab work & pertinent test results  Airway Mallampati: II       Dental  (+) Poor Dentition, Dental Advisory Given,    Pulmonary neg pulmonary ROS, former smoker,    breath sounds clear to auscultation       Cardiovascular negative cardio ROS   Rhythm:Regular Rate:Normal     Neuro/Psych    GI/Hepatic Neg liver ROS, History noted. CG   Endo/Other    Renal/GU negative Renal ROS     Musculoskeletal   Abdominal   Peds  Hematology   Anesthesia Other Findings   Reproductive/Obstetrics                            Anesthesia Physical Anesthesia Plan  ASA: II and emergent  Anesthesia Plan: General   Post-op Pain Management:    Induction: Intravenous  PONV Risk Score and Plan: 2 and Treatment may vary due to age or medical condition, Ondansetron, Dexamethasone and Midazolam  Airway Management Planned: Oral ETT  Additional Equipment:   Intra-op Plan:   Post-operative Plan: Extubation in OR  Informed Consent: I have reviewed the patients History and Physical, chart, labs and discussed the procedure including the risks, benefits and alternatives for the proposed anesthesia with the patient or authorized representative who has indicated his/her understanding and acceptance.   Dental advisory given  Plan Discussed with: CRNA and Anesthesiologist  Anesthesia Plan Comments:        Anesthesia Quick Evaluation

## 2017-04-16 NOTE — Progress Notes (Signed)
Day of Surgery   Subjective/Chief Complaint: Feels OK. Abdomen feels sore.    Objective: Vital signs in last 24 hours: Temp:  [97.4 F (36.3 C)-99.2 F (37.3 C)] 99.2 F (37.3 C) (02/09 0450) Pulse Rate:  [55-94] 94 (02/09 0450) Resp:  [12-23] 17 (02/09 0450) BP: (111-125)/(70-81) 115/71 (02/09 0450) SpO2:  [94 %-100 %] 100 % (02/09 0450) Weight:  [72.6 kg (160 lb)-73.5 kg (162 lb 0.6 oz)] 73.5 kg (162 lb 0.6 oz) (02/09 0450)    Intake/Output from previous day: 02/08 0701 - 02/09 0700 In: 2225 [I.V.:1225; IV Piggyback:1000] Out: 1515 [Urine:1450; Emesis/NG output:15; Blood:50] Intake/Output this shift: No intake/output data recorded.  General appearance: alert and cooperative Resp: clear to auscultation bilaterally Cardio: regular rate and rhythm GI: soft, nondistended, appropriately tender RLQ and groin Skin: Skin color, texture, turgor normal. No rashes or lesions Neurologic: Grossly normal Incision/Wound: R groin incision clean dry and intact with dermabond, no erythema, induration or warmth. There is crepitus in the subcutaneous tissue extending along the RLQ of the abdomen. Appropriate mild tenderness.   Lab Results:  Recent Labs    04/15/17 2338  WBC 9.0  HGB 12.9*  HCT 39.5  PLT 227   BMET Recent Labs    04/15/17 2338 04/16/17 0512  NA 142 137  K 3.5 4.0  CL 108 103  CO2 22 23  GLUCOSE 125* 122*  BUN 9 7  CREATININE 0.92 0.81  CALCIUM 8.1* 7.9*   PT/INR No results for input(s): LABPROT, INR in the last 72 hours. ABG No results for input(s): PHART, HCO3 in the last 72 hours.  Invalid input(s): PCO2, PO2  Studies/Results: Dg Abdomen 1 View  Result Date: 04/16/2017 CLINICAL DATA:  NG tube placement EXAM: ABDOMEN - 1 VIEW COMPARISON:  Chest x-ray 04/16/2017 FINDINGS: Esophageal tube is below the diaphragm, the tip is non included but is suspected to be beneath the left hemidiaphragm over the gastric fundal area. Dilated loop of small bowel  measuring 4 cm in the central abdomen to the right of midline. IMPRESSION: 1. Esophageal tube tip not included, suspected to be beneath the left diaphragm over the fundal region based on orientation of the visible side-port 2. Dilated loops of small bowel in the central and right abdomen suggesting a bowel obstruction Electronically Signed   By: Jasmine PangKim  Fujinaga M.D.   On: 04/16/2017 01:08   Dg Chest Portable 1 View  Result Date: 04/16/2017 CLINICAL DATA:  NG tube placement EXAM: PORTABLE CHEST 1 VIEW COMPARISON:  06/05/2008 FINDINGS: Esophageal tube extends below diaphragm but the tip is non included. Borderline cardiomegaly with prominent central pulmonary vessels. No acute consolidation. No pneumothorax. IMPRESSION: Esophageal tube tip extends below diaphragm but tip is not included. Negative for edema or infiltrate. Electronically Signed   By: Jasmine PangKim  Fujinaga M.D.   On: 04/16/2017 01:06    Anti-infectives: Anti-infectives (From admission, onward)   Start     Dose/Rate Route Frequency Ordered Stop   04/16/17 0106  ceFAZolin (ANCEF) 2-4 GM/100ML-% IVPB    Comments:  [name], [name]   : cabinet override      04/16/17 0106 04/16/17 1314      Assessment/Plan: s/p Procedure(s): REPAIR OF RIGHT INCARCERATED HERNIA W/ MESH (Right) 2/8 Dr Dwain SarnaWakefield -Remove NG. Sips of clears only for today until passing flatus -Remove foley -Multimodal pain control, minimize narcotics, add colace -Mobilize -Check labs tomorrow  Possible DC tomorrow if bowel function   LOS: 0 days    Berna BueChelsea A Connor 04/16/2017

## 2017-04-16 NOTE — Anesthesia Postprocedure Evaluation (Signed)
Anesthesia Post Note  Patient: Ralph Tran  Procedure(s) Performed: REPAIR OF RIGHT INCARCERATED HERNIA W/ MESH (Right )     Patient location during evaluation: PACU Anesthesia Type: General Level of consciousness: awake Pain management: pain level controlled Vital Signs Assessment: post-procedure vital signs reviewed and stable Respiratory status: spontaneous breathing Cardiovascular status: stable Anesthetic complications: no    Last Vitals:  Vitals:   04/16/17 0000 04/16/17 0249  BP: 120/76 121/71  Pulse: (!) 59 84  Resp: 12 16  Temp:  36.4 C  SpO2: 98% 94%    Last Pain:  Vitals:   04/16/17 0249  TempSrc:   PainSc: 0-No pain                 Demyah Smyre

## 2017-04-16 NOTE — H&P (Signed)
Ralph Tran is an 47 y.o. male.   Chief Complaint: right groin pain HPI: 6 yom with known right groin hernia since 2014 when saw Dr Andrey Campanile who recommended repair. He did not do it for financial reasons.  This has gotten somewhat worse over time but tonight it got acutely worse when he laughed. He now has much larger right groin bulge that extends into scrotum associated with n/v.  It is getting worse and nothing making it better.    History reviewed. No pertinent past medical history.  History reviewed. No pertinent surgical history.  Family History  Problem Relation Age of Onset  . Heart disease Father    Social History: occ etoh, smokes several cigarettes per day  Allergies: No Known Allergies  meds none  Results for orders placed or performed during the hospital encounter of 04/15/17 (from the past 48 hour(s))  I-Stat CG4 Lactic Acid, ED     Status: Abnormal   Collection Time: 04/15/17 11:26 PM  Result Value Ref Range   Lactic Acid, Venous 5.02 (HH) 0.5 - 1.9 mmol/L   Comment NOTIFIED PHYSICIAN   CBC with Differential/Platelet     Status: Abnormal   Collection Time: 04/15/17 11:38 PM  Result Value Ref Range   WBC 9.0 4.0 - 10.5 K/uL   RBC 4.08 (L) 4.22 - 5.81 MIL/uL   Hemoglobin 12.9 (L) 13.0 - 17.0 g/dL   HCT 40.9 81.1 - 91.4 %   MCV 96.8 78.0 - 100.0 fL   MCH 31.6 26.0 - 34.0 pg   MCHC 32.7 30.0 - 36.0 g/dL   RDW 78.2 95.6 - 21.3 %   Platelets 227 150 - 400 K/uL   Neutrophils Relative % 73 %   Neutro Abs 6.5 1.7 - 7.7 K/uL   Lymphocytes Relative 21 %   Lymphs Abs 1.9 0.7 - 4.0 K/uL   Monocytes Relative 5 %   Monocytes Absolute 0.5 0.1 - 1.0 K/uL   Eosinophils Relative 1 %   Eosinophils Absolute 0.1 0.0 - 0.7 K/uL   Basophils Relative 0 %   Basophils Absolute 0.0 0.0 - 0.1 K/uL    Comment: Performed at [facility], [address], [place], [place] [address]   No results found.  Review of Systems  Gastrointestinal: Positive for abdominal pain, nausea and  vomiting.  All other systems reviewed and are negative.   Blood pressure 111/74, pulse (!) 55, temperature 98.3 F (36.8 C), temperature source Rectal, resp. rate 20, height 6\' 3"  (1.905 m), weight 72.6 kg (160 lb), SpO2 100 %. Physical Exam  Vitals reviewed. Constitutional: He is oriented to person, place, and time. He appears well-developed and well-nourished.  HENT:  Head: Normocephalic and atraumatic.  Right Ear: External ear normal.  Left Ear: External ear normal.  Mouth/Throat: Oropharynx is clear and moist.  Eyes: Pupils are equal, round, and reactive to light. No scleral icterus.  Neck: Neck supple.  Cardiovascular: Normal rate, regular rhythm, normal heart sounds and intact distal pulses.  Respiratory: Effort normal and breath sounds normal. He has no wheezes. He has no rales.  GI: Soft. Bowel sounds are normal. There is no tenderness. A hernia is present. Hernia confirmed positive in the right inguinal area (tender scrotal incarcerated hernia). Hernia confirmed negative in the left inguinal area.  Genitourinary: Penis normal.  Lymphadenopathy:    He has no cervical adenopathy.  Neurological: He is alert and oriented to person, place, and time.  Skin: Skin is warm. He is diaphoretic.  Psychiatric: He has a normal mood and affect. His behavior is normal.     Assessment/Plan Incarcerated RIH  Right groin exploration with inguinal hernia repair likely with mesh, possible bowel resection, possible laparotomy  I described the procedure in detail.  Concern is for ischemic bowel so will proceed to or asap. He understands could involve bowel resection or laparotomy. Also if unable to fix with permanent mesh his recurrence risk much higher. Goals should be achieved with surgery. We discussed the usage of mesh and the rationale behind that. We went over the pathophysiology of inguinal hernia.  We discussed the risks including bleeding, infection, recurrence, postoperative pain and  chronic groin pain, testicular injury, urinary retention, numbness in groin and around incision. He will have ng and foley postop and likely be here a couple of days postop.      Emelia LoronMatthew Modest Draeger, MD 04/16/2017, 12:10 AM

## 2017-04-17 LAB — BASIC METABOLIC PANEL
Anion gap: 9 (ref 5–15)
BUN: 6 mg/dL (ref 6–20)
CALCIUM: 8 mg/dL — AB (ref 8.9–10.3)
CO2: 25 mmol/L (ref 22–32)
CREATININE: 0.99 mg/dL (ref 0.61–1.24)
Chloride: 105 mmol/L (ref 101–111)
GFR calc Af Amer: >60 mL/min (ref >60)
GLUCOSE: 184 mg/dL — AB (ref 65–99)
Potassium: 3.4 mmol/L — ABNORMAL LOW (ref 3.5–5.1)
SODIUM: 139 mmol/L (ref 135–145)

## 2017-04-17 LAB — CBC
HCT: 34.8 % — ABNORMAL LOW (ref 39.0–52.0)
Hemoglobin: 11.3 g/dL — ABNORMAL LOW (ref 13.0–17.0)
MCH: 31.2 pg (ref 26.0–34.0)
MCHC: 32.5 g/dL (ref 30.0–36.0)
MCV: 96.1 fL (ref 78.0–100.0)
PLATELETS: 236 10*3/uL (ref 150–400)
RBC: 3.62 MIL/uL — AB (ref 4.22–5.81)
RDW: 13.6 % (ref 11.5–15.5)
WBC: 8 10*3/uL (ref 4.0–10.5)

## 2017-04-17 LAB — MAGNESIUM: MAGNESIUM: 2.1 mg/dL (ref 1.7–2.4)

## 2017-04-17 MED ORDER — DIPHENHYDRAMINE HCL 25 MG PO CAPS: 25.0000 mg | ORAL_CAPSULE | Freq: Three times a day (TID) | ORAL | Status: DC | PRN

## 2017-04-17 MED ORDER — OXYCODONE HCL 5 MG PO TABS: 5.0000 mg | ORAL_TABLET | Freq: Four times a day (QID) | ORAL | 0 refills | Status: AC | PRN

## 2017-04-17 MED ORDER — DOCUSATE SODIUM 100 MG PO CAPS: 100.0000 mg | ORAL_CAPSULE | Freq: Two times a day (BID) | ORAL | 1 refills | Status: AC

## 2017-04-17 NOTE — Progress Notes (Signed)
1 Day Post-Op   Subjective/Chief Complaint: Doing well, no n/v, having flatus, voiding, pain controlled, wants to go home   Objective: Vital signs in last 24 hours: Temp:  [98.3 F (36.8 C)-99.9 F (37.7 C)] 98.3 F (36.8 C) (02/10 0537) Pulse Rate:  [64-70] 64 (02/10 0537) Resp:  [15-16] 16 (02/10 0537) BP: (100-111)/(56-67) 103/66 (02/10 0537) SpO2:  [100 %] 100 % (02/10 0537)    Intake/Output from previous day: 02/09 0701 - 02/10 0700 In: 1775 [P.O.:200; I.V.:1575] Out: 2105 [Urine:2105] Intake/Output this shift: No intake/output data recorded.  Resp: clear to auscultation bilaterally Cardio: regular rate and rhythm GI: soft incision clean no more crepitus bs present nontender  Lab Results:  Recent Labs    04/15/17 2338  WBC 9.0  HGB 12.9*  HCT 39.5  PLT 227   BMET Recent Labs    04/15/17 2338 04/16/17 0512  NA 142 137  K 3.5 4.0  CL 108 103  CO2 22 23  GLUCOSE 125* 122*  BUN 9 7  CREATININE 0.92 0.81  CALCIUM 8.1* 7.9*   PT/INR No results for input(s): LABPROT, INR in the last 72 hours. ABG No results for input(s): PHART, HCO3 in the last 72 hours.  Invalid input(s): PCO2, PO2  Studies/Results: Dg Abdomen 1 View  Result Date: 04/16/2017 CLINICAL DATA:  NG tube placement EXAM: ABDOMEN - 1 VIEW COMPARISON:  Chest x-ray 04/16/2017 FINDINGS: Esophageal tube is below the diaphragm, the tip is non included but is suspected to be beneath the left hemidiaphragm over the gastric fundal area. Dilated loop of small bowel measuring 4 cm in the central abdomen to the right of midline. IMPRESSION: 1. Esophageal tube tip not included, suspected to be beneath the left diaphragm over the fundal region based on orientation of the visible side-port 2. Dilated loops of small bowel in the central and right abdomen suggesting a bowel obstruction Electronically Signed   By: Jasmine PangKim  Fujinaga M.D.   On: 04/16/2017 01:08   Dg Chest Portable 1 View  Result Date:  04/16/2017 CLINICAL DATA:  NG tube placement EXAM: PORTABLE CHEST 1 VIEW COMPARISON:  06/05/2008 FINDINGS: Esophageal tube extends below diaphragm but the tip is non included. Borderline cardiomegaly with prominent central pulmonary vessels. No acute consolidation. No pneumothorax. IMPRESSION: Esophageal tube tip extends below diaphragm but tip is not included. Negative for edema or infiltrate. Electronically Signed   By: Jasmine PangKim  Fujinaga M.D.   On: 04/16/2017 01:06    Anti-infectives: Anti-infectives (From admission, onward)   Start     Dose/Rate Route Frequency Ordered Stop   04/16/17 0106  ceFAZolin (ANCEF) 2-4 GM/100ML-% IVPB    Comments:  [name], [name]   : cabinet override      04/16/17 0106 04/16/17 1314      Assessment/Plan: POD 1 RIH, incarcerated repair  Doing great Will dc home if tolerates diet  Ralph Tran 04/17/2017

## 2017-04-18 ENCOUNTER — Encounter (HOSPITAL_COMMUNITY): Payer: Self-pay | Admitting: General Surgery

## 2019-04-05 IMAGING — DX DG ABDOMEN 1V
1 series · 1 of 1 positions shown · non-contrast
Comparison: Chest x-ray 04/16/2017

CLINICAL DATA: NG tube placement

EXAM:
ABDOMEN - 1 VIEW

[abdomen kub]
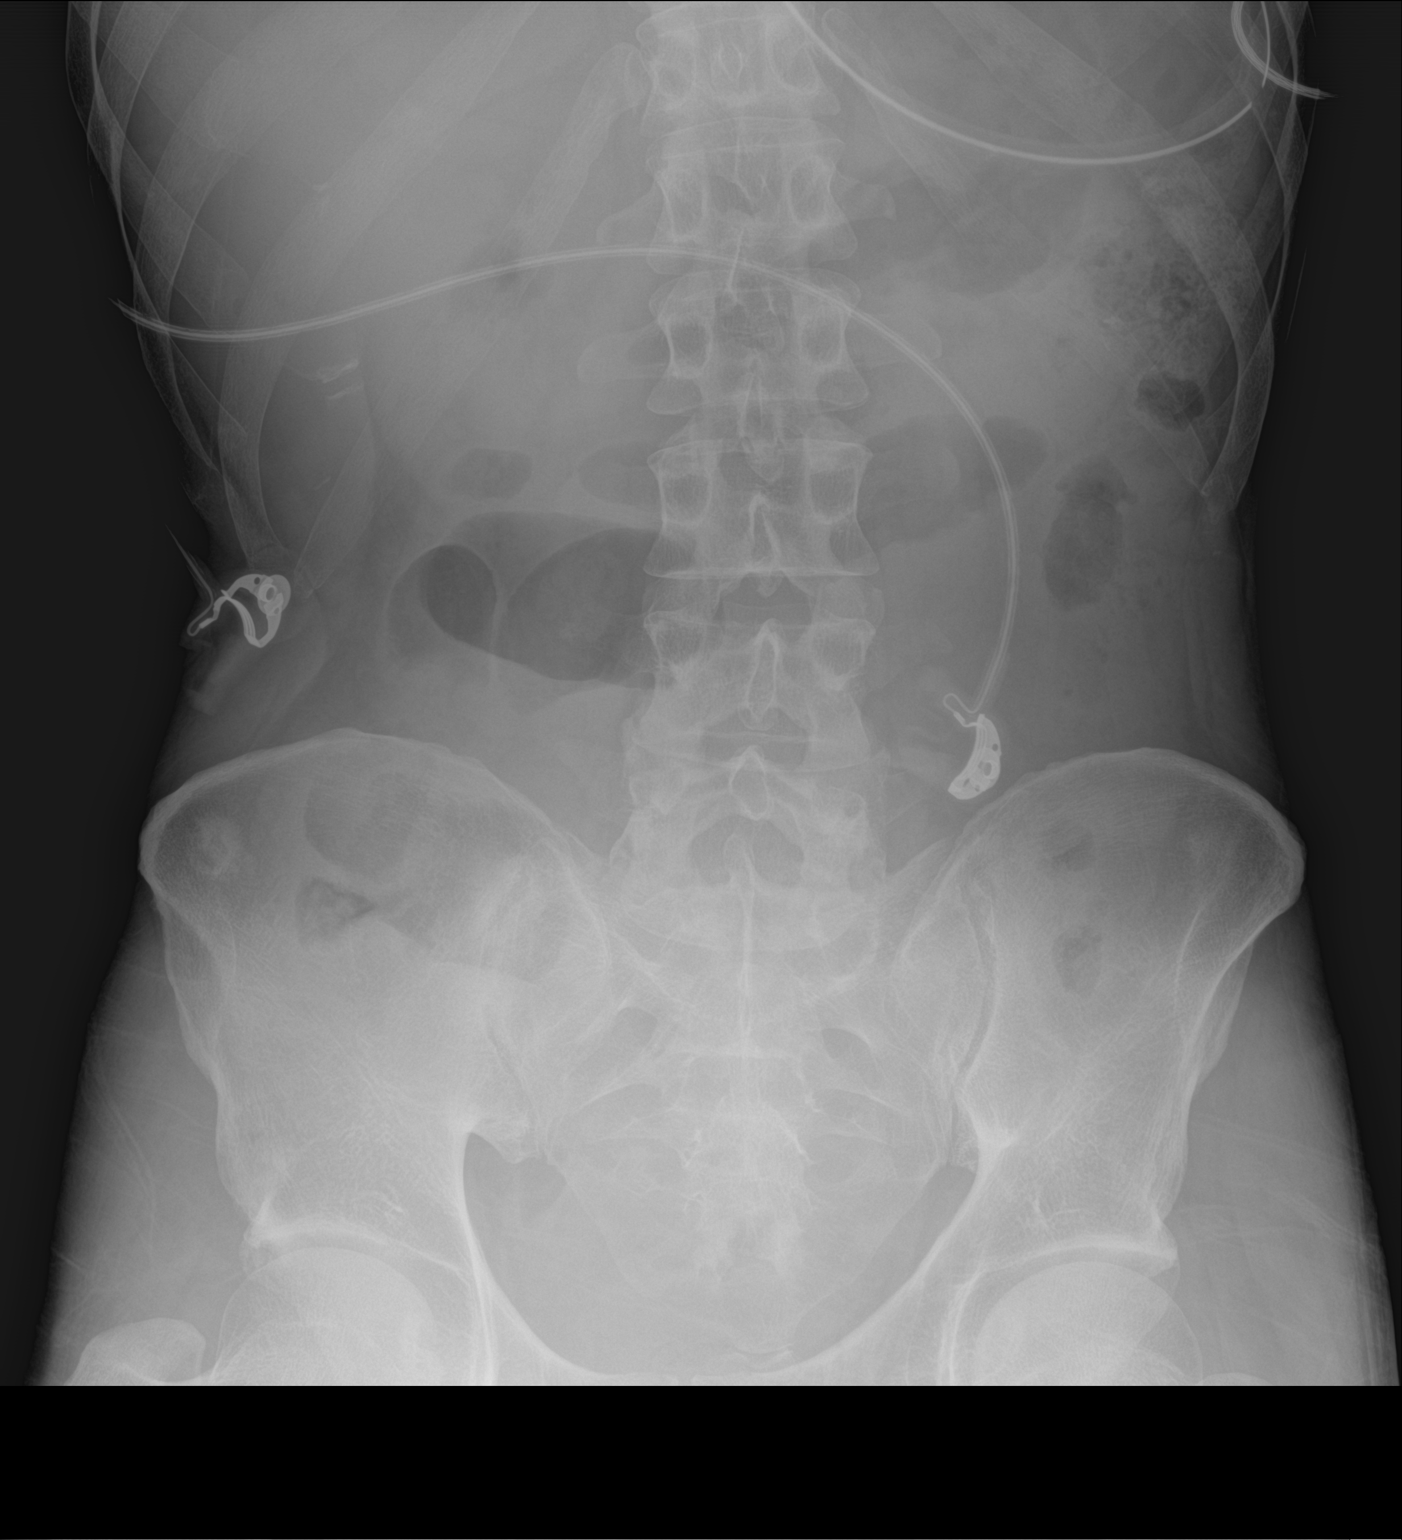

[1 of 1 positions shown; findings below may reference images not displayed]

FINDINGS: Esophageal tube is below the diaphragm, the tip is non included but
is suspected to be beneath the left hemidiaphragm over the gastric
fundal area. Dilated loop of small bowel measuring 4 cm in the
central abdomen to the right of midline.
IMPRESSION: 1. Esophageal tube tip not included, suspected to be beneath the
left diaphragm over the fundal region based on orientation of the
visible side-port
2. Dilated loops of small bowel in the central and right abdomen
suggesting a bowel obstruction

## 2020-07-18 ENCOUNTER — Encounter (HOSPITAL_COMMUNITY): Payer: Self-pay

## 2020-07-18 ENCOUNTER — Other Ambulatory Visit: Payer: Self-pay

## 2020-07-18 ENCOUNTER — Ambulatory Visit (HOSPITAL_COMMUNITY)
Admission: EM | Admit: 2020-07-18 | Discharge: 2020-07-18 | Disposition: A | Discharge status: Home / Self Care | Payer: Self-pay

## 2020-07-18 DIAGNOSIS — R0602 Shortness of breath: Secondary | ICD-10-CM

## 2020-07-18 NOTE — ED Provider Notes (Signed)
[facility]    CSN: [id number] Arrival date & time: 07/18/20  1935      History   Chief Complaint Chief Complaint  Patient presents with  . Shortness of Breath    HPI Ralph Tran is a 50 y.o. male.   Patient here for evaluation of shortness of breath that has been ongoing for the past several months.  Patient does report having a smoking history and recently participated in a smoking study.  States that one of the test told him that he "had the lungs of 50 year old."  Reports recently having worsening shortness of breath especially on exertion.  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  Has not taken any OTC medications or treatments.  Does not have a primary care provider.  Denies any fevers, chest pain, N/V/D, numbness, tingling, weakness, abdominal pain, or headaches.     The history is provided by the patient.  Shortness of Breath   History reviewed. No pertinent past medical history.  Patient Active Problem List   Diagnosis Date Noted  . Incarcerated inguinal hernia 04/16/2017  . Right inguinal hernia 05/25/2012    Past Surgical History:  Procedure Laterality Date  . INGUINAL HERNIA REPAIR Right 04/16/2017   Procedure: REPAIR OF RIGHT INCARCERATED HERNIA W/ MESH;  Surgeon: Emelia Loron, MD;  Location: [facility] OR;  Service: General;  Laterality: Right;       Home Medications    Prior to Admission medications   Medication Sig Start Date End Date Taking? Authorizing Provider  docusate sodium (COLACE) 100 MG capsule Take 1 capsule (100 mg total) by mouth 2 (two) times daily. 04/17/17   Emelia Loron, MD  oxyCODONE (OXY IR/ROXICODONE) 5 MG immediate release tablet Take 1 tablet (5 mg total) by mouth every 6 (six) hours as needed for severe pain or breakthrough pain. 04/17/17   Emelia Loron, MD    Family History Family History  Problem Relation Age of Onset  . Heart  disease Father     Social History Social History   Tobacco Use  . Smoking status: Former Smoker    Types: Cigarettes    Quit date: 05/06/2008    Years since quitting: 12.2  . Smokeless tobacco: Never Used  Substance Use Topics  . Alcohol use: Yes    Alcohol/week: 4.0 standard drinks    Types: 4 Cans of beer per week    Comment: Daily.  . Drug use: No     Allergies   Patient has no known allergies.   Review of Systems Review of Systems  Respiratory: Positive for shortness of breath.   All other systems reviewed and are negative.    Physical Exam Triage Vital Signs ED Triage Vitals  Enc Vitals Group     BP 07/18/20 1942 (!) 126/92     Pulse Rate 07/18/20 1942 80     Resp 07/18/20 1942 20     Temp 07/18/20 1942 98.7 F (37.1 C)     Temp src --      SpO2 07/18/20 1942 99 %     Weight --      Height --      Head Circumference --      Peak Flow --      Pain Score 07/18/20 1941 0     Pain Loc --      Pain Edu? --      Excl. in GC? --  No data found.  Updated Vital Signs BP (!) 126/92   Pulse 80   Temp 98.7 F (37.1 C)   Resp 20   SpO2 99%   Visual Acuity Right Eye Distance:   Left Eye Distance:   Bilateral Distance:    Right Eye Near:   Left Eye Near:    Bilateral Near:     Physical Exam Vitals and nursing note reviewed.  Constitutional:      General: He is not in acute distress.    Appearance: Normal appearance. He is not ill-appearing, toxic-appearing or diaphoretic.     Interventions: He is not intubated. HENT:     Head: Normocephalic and atraumatic.  Eyes:     Conjunctiva/sclera: Conjunctivae normal.  Cardiovascular:     Rate and Rhythm: Normal rate and regular rhythm.     Pulses: Normal pulses.     Heart sounds: Normal heart sounds.  Pulmonary:     Effort: Pulmonary effort is normal. No tachypnea, bradypnea, accessory muscle usage or respiratory distress. He is not intubated.     Breath sounds: Normal breath sounds. No stridor. No  decreased breath sounds, wheezing or rales.  Chest:     Chest wall: No mass, deformity, tenderness, crepitus or edema. There is no dullness to percussion.  Abdominal:     General: Abdomen is flat.  Musculoskeletal:        General: Normal range of motion.     Cervical back: Normal range of motion.  Skin:    General: Skin is warm and dry.  Neurological:     General: No focal deficit present.     Mental Status: He is alert and oriented to person, place, and time.  Psychiatric:        Mood and Affect: Mood normal.      UC Treatments / Results  Labs (all labs ordered are listed, but only abnormal results are displayed) Labs Reviewed - No data to display  EKG   Radiology No results found.  Procedures Procedures (including critical care time)  Medications Ordered in UC Medications - No data to display  Initial Impression / Assessment and Plan / UC Course  I have reviewed the triage vital signs and the nursing notes.  Pertinent labs & imaging results that were available during my care of the patient were reviewed by me and considered in my medical decision making (see chart for details).    Assessment negative for red flags or concerns.  Pulse ox normal and lung sounds clear in all fields.  Recommend following up with a primary care provider for further evaluation and possible referral to pulmonary.  Patient agreeable to plan.  Instructed patient to return or go to the emergency room for any worsening symptoms.  PCP assistance started. Final Clinical Impressions(s) / UC Diagnoses   Final diagnoses:  Shortness of breath     Discharge Instructions     Someone will contact you to help you find a primary care provider.   Get established and follow up with a primary care provider for long-term management of your shortness of breath.     ED Prescriptions    None     PDMP not reviewed this encounter.   Ivette Loyal, NP 07/18/20 2045

## 2020-07-18 NOTE — ED Triage Notes (Signed)
Pt in with c/o SOB that started 3 months ago and worsened over the last month  States he had a bad cold 3 months ago and the sob started then and has been lingering

## 2020-07-18 NOTE — Discharge Instructions (Signed)
Someone will contact you to help you find a primary care provider.   Get established and follow up with a primary care provider for long-term management of your shortness of breath.

## 2020-07-27 ENCOUNTER — Encounter: Payer: Self-pay | Admitting: *Deleted
